# Patient Record
Sex: Female | Born: 1990 | Hispanic: Yes | Marital: Married | Smoking: Never smoker
Health system: Southern US, Community
[De-identification: ages and names within clinical notes are randomized; demographics above are authoritative.]

---
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
## 2012-06-28 ENCOUNTER — Emergency Department: Payer: Self-pay | Admitting: Unknown Physician Specialty

## 2013-08-29 ENCOUNTER — Emergency Department: Payer: Self-pay | Admitting: Emergency Medicine

## 2013-08-29 LAB — COMPREHENSIVE METABOLIC PANEL
ALT: 30 U/L
ANION GAP: 9 (ref 7–16)
AST: 27 U/L (ref 15–37)
Albumin: 3.6 g/dL (ref 3.4–5.0)
Alkaline Phosphatase: 67 U/L
BUN: 7 mg/dL (ref 7–18)
Bilirubin,Total: 0.2 mg/dL (ref 0.2–1.0)
CALCIUM: 8.5 mg/dL (ref 8.5–10.1)
CREATININE: 0.57 mg/dL — AB (ref 0.60–1.30)
Chloride: 107 mmol/L (ref 98–107)
Co2: 21 mmol/L (ref 21–32)
EGFR (African American): >60
GLUCOSE: 96 mg/dL (ref 65–99)
Osmolality: 272 (ref 275–301)
Potassium: 3.4 mmol/L — ABNORMAL LOW (ref 3.5–5.1)
Sodium: 137 mmol/L (ref 136–145)
TOTAL PROTEIN: 7.8 g/dL (ref 6.4–8.2)

## 2013-08-29 LAB — URINALYSIS, COMPLETE
BACTERIA: NONE SEEN
Bilirubin,UR: NEGATIVE
Blood: NEGATIVE
GLUCOSE, UR: NEGATIVE mg/dL (ref 0–75)
KETONE: NEGATIVE
Nitrite: NEGATIVE
PH: 5 (ref 4.5–8.0)
PROTEIN: NEGATIVE
RBC,UR: 3 /HPF (ref 0–5)
Specific Gravity: 1.024 (ref 1.003–1.030)
Squamous Epithelial: 7

## 2013-08-29 LAB — HCG, QUANTITATIVE, PREGNANCY: Beta Hcg, Quant.: 68764 m[IU]/mL — ABNORMAL HIGH

## 2013-10-11 ENCOUNTER — Encounter: Payer: Self-pay | Admitting: Obstetrics and Gynecology

## 2014-01-24 ENCOUNTER — Encounter: Payer: Self-pay | Admitting: Obstetrics and Gynecology

## 2014-03-26 ENCOUNTER — Ambulatory Visit: Payer: Self-pay | Admitting: Family Medicine

## 2014-03-26 IMAGING — US US OB FOLLOW-UP
1 series · 13 of 28 positions shown · non-contrast
Comparison: none

CLINICAL DATA: Size less than dates

EXAM:
OBSTETRIC 14+ WK ULTRASOUND FOLLOW-UP

[Series 1: us ob follow-up · 0.22mm/px · 13 of 31 slices shown]
[im 2/31]
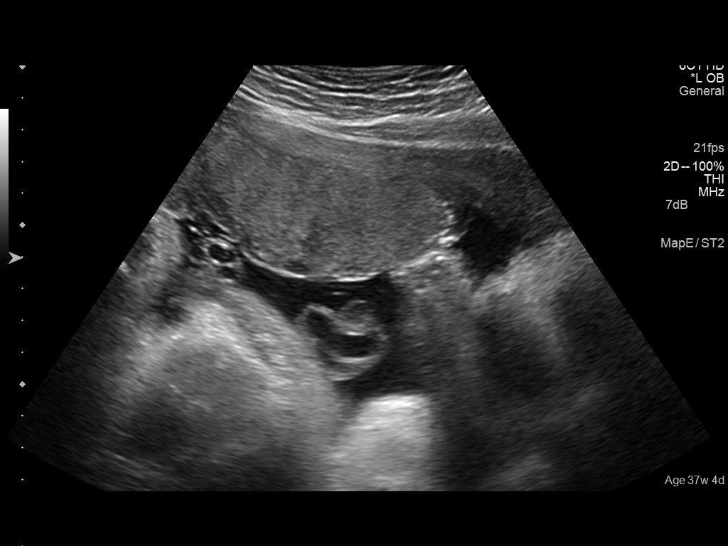
[im 4/31]
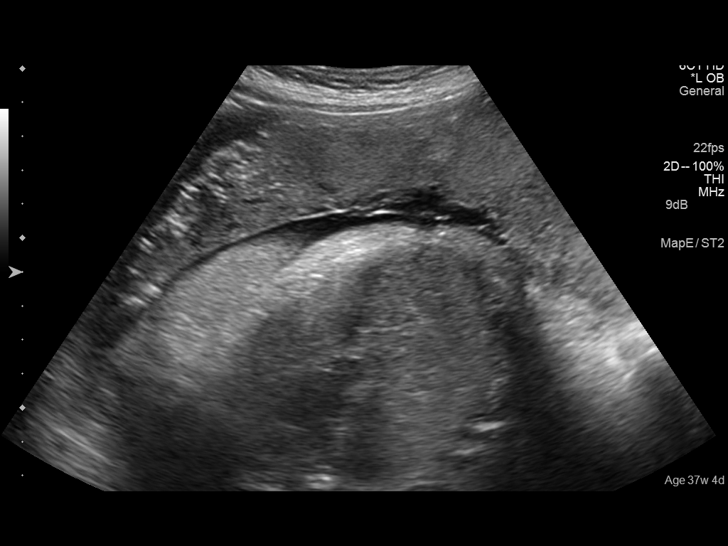
[im 6/31]
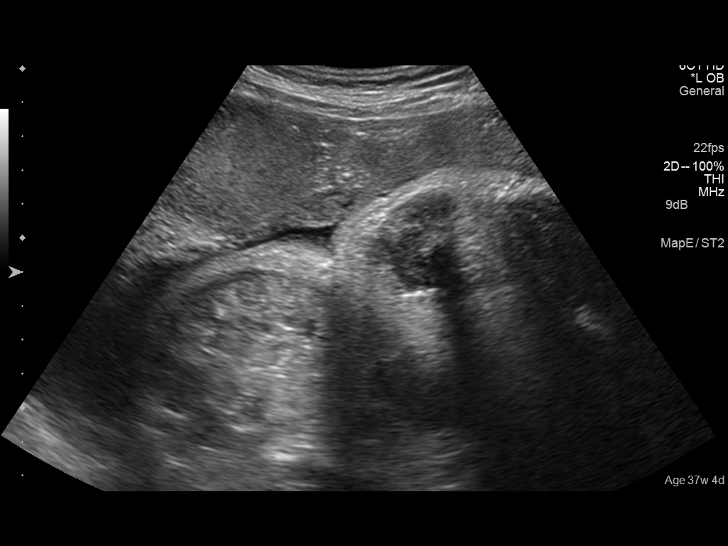
[im 8/31]
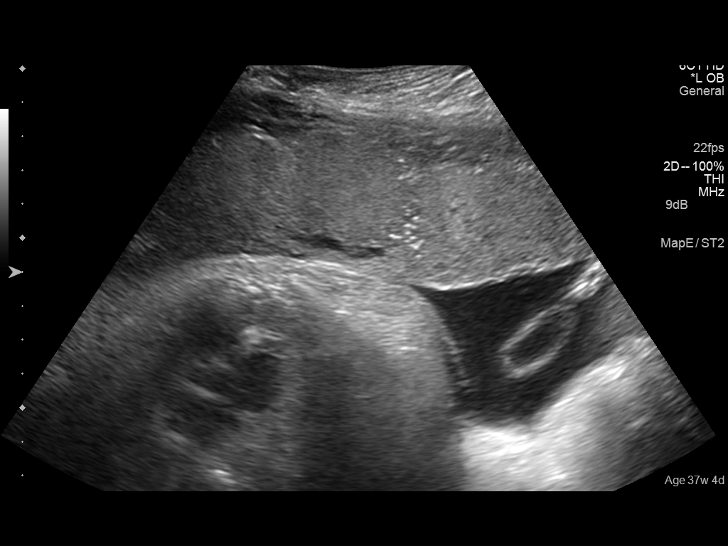
[im 11/31]
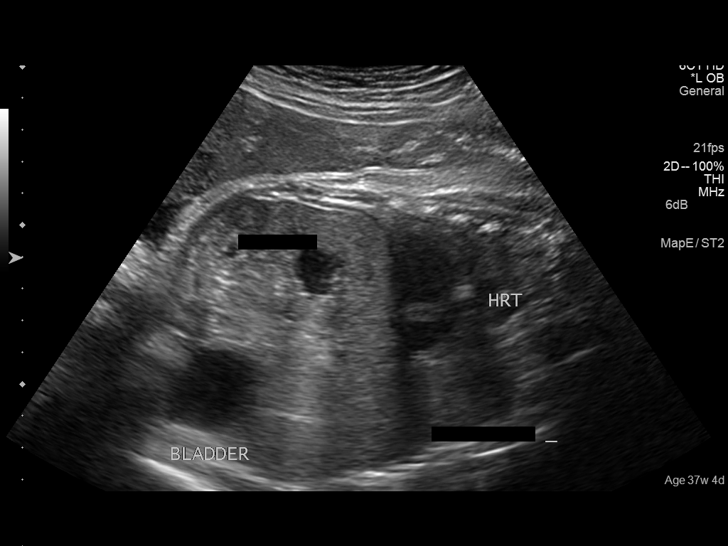
[im 13/31]
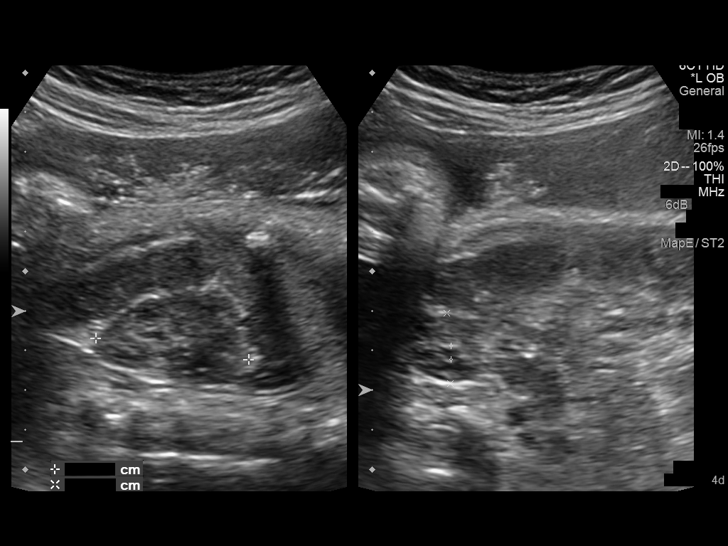
[im 16/31]
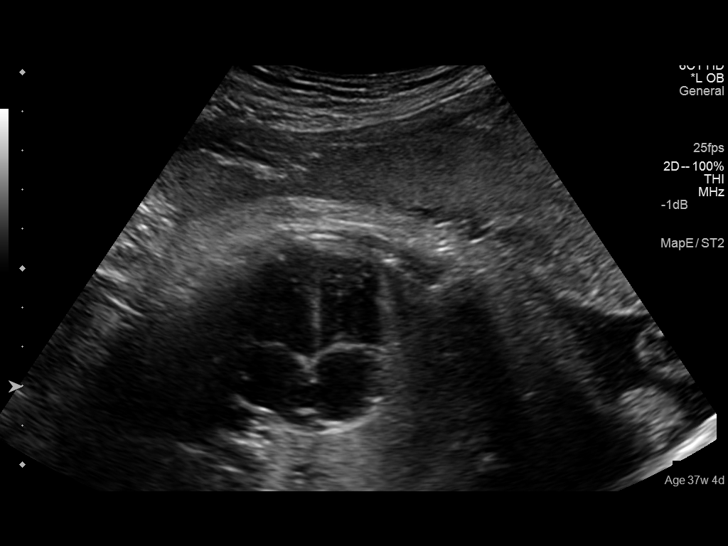
[im 18/31]
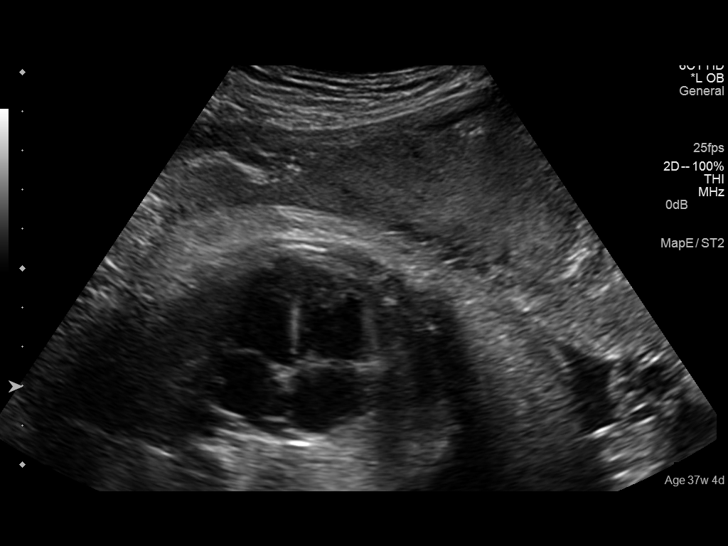
[im 21/31]
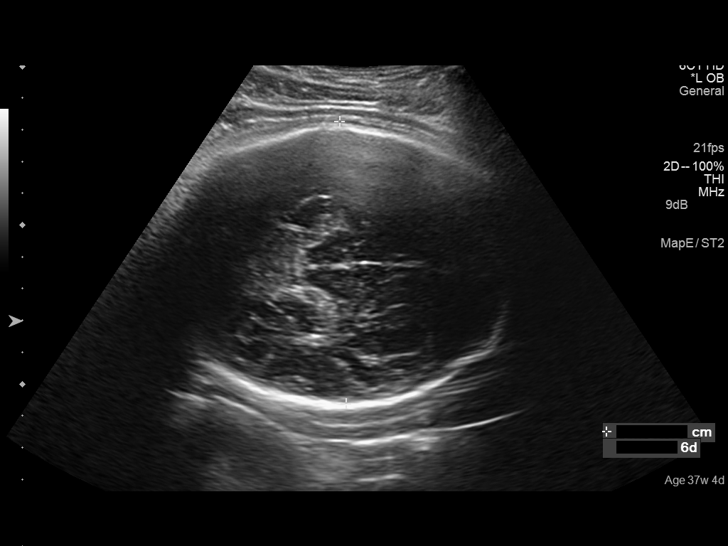
[im 23/31]
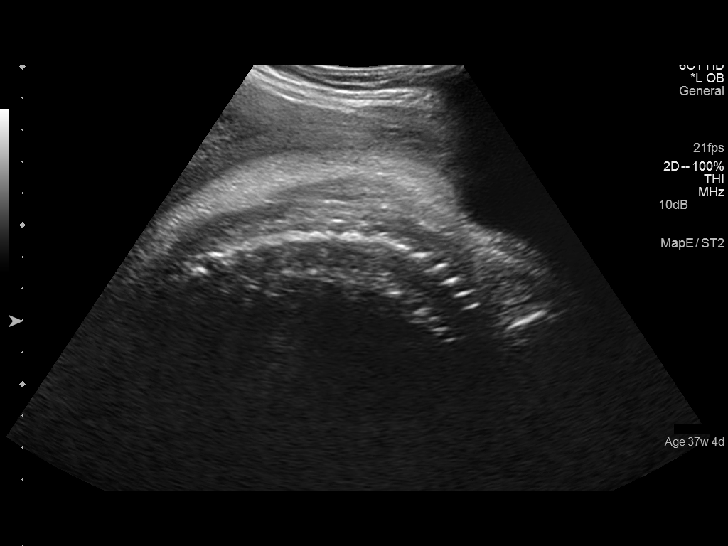
[im 25/31]
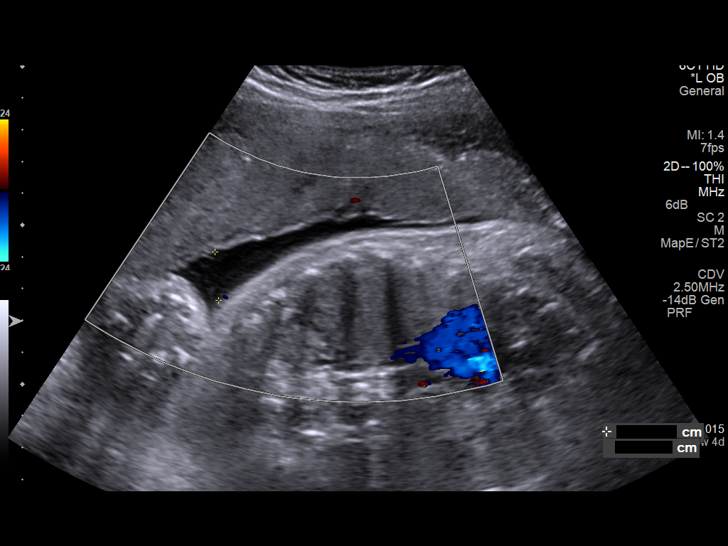
[im 27/31]
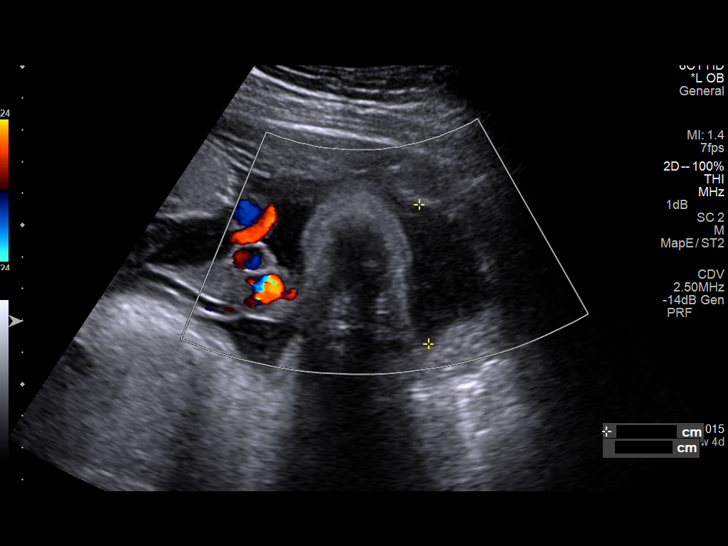
[im 29/31]
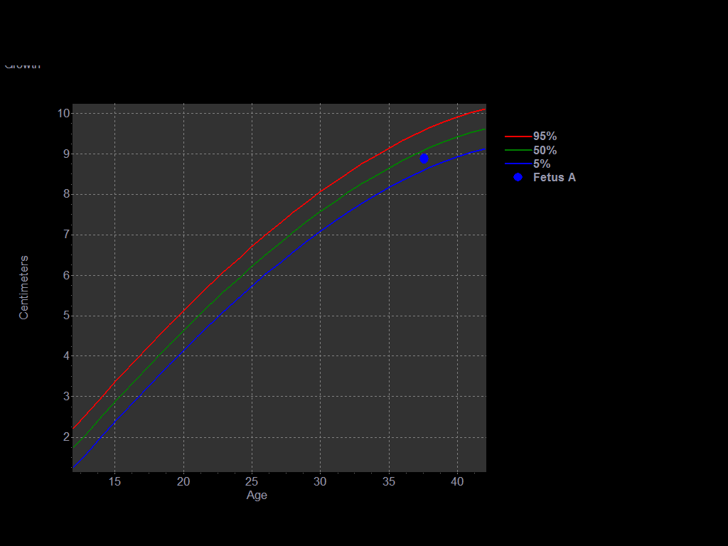

[13 of 28 positions shown; findings below may reference images not displayed]

FINDINGS: Number of Fetuses: 1

Heart Rate:  147 bpm

Movement: Present

Presentation: Cephalic

Previa: None

Placental Location: Anterior and partially fundal

Amniotic Fluid (Subjective): Normal

Amniotic Fluid (Objective):  Normal

AFI 10.0 cm cm (5%ile= 7.5 cm, 95%= 24.4 cm for 37 wks)

FETAL BIOMETRY

BPD:  8.9cm 35w   6d

HC:    33.1cm  37w   5d

AC:   33.7cm  37w   4d

FL:   7.33cm  37w   4d

Current Mean GA: 37w 1d          US EDC: [DATE]

Assigned GA:  37w 4d              Assigned EDC:  [DATE]

Estimated Fetal Weight:  3189g 54%ile

FETAL ANATOMY

Lateral Ventricles: Previously visualized

Thalami/CSP: Previously visualized

Posterior Fossa:  Previously visualized

Upper Lip: Previously visualized

Spine: Previously visualized

4 Chamber Heart on Left: Visualized

LVOT: Previously visualized  RVOT: Previously visualized

Stomach on Left: Visualized

3 Vessel Cord: Previously visualized

Cord Insertion site: Limited visualization previously.

Kidneys: Visualized

Bladder: Visualized

Extremities: Previously visualized

Sex: Female genitalia previously visualized

MATERNAL FINDINGS:

Cervix: Not evaluated
IMPRESSION: There is a viable IUP with cephalic presentation and anterior
placenta. The amniotic fluid volume is normal. The estimated
gestational age is 37 weeks 1 day which is in reasonable agreement
with the previous dating. The estimated date of confinement is [DATE]. The visualized anatomic structures are unremarkable. Is is
as

## 2014-03-30 ENCOUNTER — Emergency Department: Payer: Self-pay | Admitting: Emergency Medicine

## 2014-04-11 ENCOUNTER — Inpatient Hospital Stay: Payer: Self-pay

## 2014-06-03 LAB — SURGICAL PATHOLOGY

## 2014-06-18 NOTE — H&P (Signed)
L&D Evaluation:  History:  HPI 24 yo Hispanic female with EDC=04/12/2014 by a 7wk5d ultrasound presents to L&D with c/o contractions q5-10 min apart since 0300. Denies vaginal bleeding or LOF. PNC at [facility] notable for Chlamydia with a negative TOC, and mild iron deficiency anemia. Last vaginal delivery 2011- female 6#5oz. LABS: O POS/ UTD on MMR and Varicella vaccines/ GBS negative. HIV, RPR and HBsAG negative. glucola=111. Plans on breast feeding. Nexplanon for [facility]   Presents with contractions   Patient's Medical History No Chronic Illness   Medications Pre Natal Vitamins  Iron   Allergies NKDA   Social History none   Family History Non-Contributory   ROS:  ROS see HPI   Exam:  Vital Signs initially 130/89 then 116/75   General breathing with ctxs   Mental Status clear   Chest clear   Heart normal sinus rhythm, no murmur/gallop/rubs   Abdomen gravid, tender with contractions   Estimated Fetal Weight Average for gestational age   Fetal Position cephalic   Edema no edema   Reflexes 2+   Pelvic no external lesions, 4-5/80%/-2 per RN exam   Mebranes Intact   FHT normal rate with no decels   Ucx regular, q2-3 min   Skin dry   Impression:  Impression IUP at 39.6 weeks in labor   Plan:  Plan Admit and anticipate vaginal delivery. EFM. monitor progress and  fetal well being.   Electronic Signatures: Karene Fry (CNM)  (Signed 03-Mar-16 07:31)  Authored: L&D Evaluation   Last Updated: 03-Mar-16 07:31 by Karene Fry (CNM)

## 2019-04-28 ENCOUNTER — Ambulatory Visit: Payer: Self-pay | Attending: Internal Medicine

## 2019-04-28 DIAGNOSIS — Z23 Encounter for immunization: Secondary | ICD-10-CM

## 2019-04-28 NOTE — Progress Notes (Signed)
   Covid-19 Vaccination Clinic  Name:  Amanda Collins    MRN: [id number] DOB: April 12, 1990  04/28/2019  [name] was observed post Covid-19 immunization for 15 minutes without incident. She was provided with Vaccine Information Sheet and instruction to access the V-Safe system.   [name] was instructed to call 911 with any severe reactions post vaccine: Marland Kitchen Difficulty breathing  . Swelling of face and throat  . A fast heartbeat  . A bad rash all over body  . Dizziness and weakness   Immunizations Administered    Name Date Dose VIS Date Route   Pfizer COVID-19 Vaccine 04/28/2019  9:03 AM 0.3 mL 01/19/2019 Intramuscular   Manufacturer: [employer], [employer]   Lot: [id number]   NDC: [address]-8

## 2019-05-19 ENCOUNTER — Ambulatory Visit: Payer: Self-pay | Attending: Internal Medicine

## 2019-05-19 DIAGNOSIS — Z23 Encounter for immunization: Secondary | ICD-10-CM

## 2019-05-19 NOTE — Progress Notes (Signed)
   Covid-19 Vaccination Clinic  Name:  Amanda Collins    MRN: [id number] DOB: 09/19/1990  05/19/2019  [name] was observed post Covid-19 immunization for 15 minutes without incident. She was provided with Vaccine Information Sheet and instruction to access the V-Safe system.   [name] was instructed to call 911 with any severe reactions post vaccine: Marland Kitchen Difficulty breathing  . Swelling of face and throat  . A fast heartbeat  . A bad rash all over body  . Dizziness and weakness   Immunizations Administered    Name Date Dose VIS Date Route   Pfizer COVID-19 Vaccine 05/19/2019  3:18 PM 0.3 mL 01/19/2019 Intramuscular   Manufacturer: [employer], [employer]   Lot: [id number]   NDC: [address]-4

## 2021-03-09 ENCOUNTER — Encounter: Payer: Self-pay | Admitting: Nurse Practitioner

## 2021-03-09 ENCOUNTER — Other Ambulatory Visit: Payer: Self-pay

## 2021-03-09 ENCOUNTER — Ambulatory Visit (LOCAL_COMMUNITY_HEALTH_CENTER): Payer: Self-pay | Admitting: Nurse Practitioner

## 2021-03-09 VITALS — BP 101/69 | Ht <= 58 in | Wt 146.2 lb

## 2021-03-09 DIAGNOSIS — Z3009 Encounter for other general counseling and advice on contraception: Secondary | ICD-10-CM

## 2021-03-09 DIAGNOSIS — Z01419 Encounter for gynecological examination (general) (routine) without abnormal findings: Secondary | ICD-10-CM

## 2021-03-09 LAB — WET PREP FOR TRICH, YEAST, CLUE
Trichomonas Exam: NEGATIVE
Yeast Exam: NEGATIVE

## 2021-03-09 LAB — HM HIV SCREENING LAB: HM HIV Screening: NEGATIVE

## 2021-03-09 NOTE — Progress Notes (Signed)
Patient seen today for PE , PAP and STD check. Condoms declined. Wet prep reviewed, no tx per standing orders. No S/S's per patient. PCP list given to patient.

## 2021-03-09 NOTE — Progress Notes (Addendum)
[facility] Number: [phone]    Family Planning Visit- Initial Visit  Subjective:  Amanda Collins is a 31 y.o.  G2P1011   being seen today for an initial annual visit and to discuss reproductive life planning.  The patient is currently using IUD or IUS for pregnancy prevention. Patient reports   does not want a pregnancy in the next year.  Patient has the following medical conditions does not have a problem list on file.  Chief Complaint  Patient presents with   Annual Exam    IUD check    Patient reports to clinic today for a physical and STD screening.  Patient also had questions of how long her IUD was good for, she is contemplating on a future pregnancy.   Patient denies any current signs and symptoms.     Body mass index is 32.78 kg/m. - Patient is eligible for diabetes screening based on BMI and age [id number]?  not applicable [id number] ordered? not applicable  Patient reports 1  partner/s in last year. Desires STI screening?  Yes  Has patient been screened once for HCV in the past?  No  No results found for: HCVAB  Does the patient have current drug use (including MJ), have a partner with drug use, and/or has been incarcerated since last result? No  If yes-- Screen for HCV through [facility]   Does the patient meet criteria for HBV testing? No  Criteria:  -Household, sexual or needle sharing contact with HBV -History of drug use -HIV positive -Those with known Hep C   Health Maintenance Due  Topic Date Due   HIV Screening  Never done   Hepatitis C Screening  Never done   TETANUS/TDAP  Never done   PAP-Cervical Cytology Screening  Never done   PAP SMEAR-Modifier  Never done   COVID-19 Vaccine (3 - Booster for Pfizer series) 07/14/2019   INFLUENZA VACCINE  Never done    Review of Systems  Constitutional:  Negative for chills, fever, malaise/fatigue and weight loss.  HENT:   Negative for congestion, hearing loss and sore throat.   Eyes:  Positive for blurred vision. Negative for double vision and photophobia.       Patient states she at time cannot see far away.    Respiratory:  Negative for shortness of breath.   Cardiovascular:  Negative for chest pain.  Gastrointestinal:  Negative for abdominal pain, blood in stool, constipation, diarrhea, heartburn, nausea and vomiting.  Genitourinary:  Negative for dysuria and frequency.  Musculoskeletal:  Negative for back pain, joint pain and neck pain.  Skin:  Negative for itching and rash.  Neurological:  Negative for dizziness, weakness and headaches.  Endo/Heme/Allergies:  Does not bruise/bleed easily.  Psychiatric/Behavioral:  Negative for depression, substance abuse and suicidal ideas.    The following portions of the patient's history were reviewed and updated as appropriate: allergies, current medications, past family history, past medical history, past social history, past surgical history and problem list. Problem list updated.   See flowsheet for other program required questions.  Objective:   Vitals:   03/09/21 0848  BP: 101/69  Weight: 146 lb 3.2 oz (66.3 kg)  Height: 4\' 8"  (1.422 m)    Physical Exam HENT:     Head: Normocephalic.     Nose: Nose normal.     Mouth/Throat:     Mouth: Mucous membranes are moist.     Comments:  No visible dental caries. Patient had last dental visit 03/07/2021. Eyes:     Pupils: Pupils are equal, round, and reactive to light.  Cardiovascular:     Rate and Rhythm: Normal rate and regular rhythm.  Pulmonary:     Effort: Pulmonary effort is normal.     Breath sounds: Normal breath sounds.  Chest:     Comments: Breasts:        Right: Normal. No swelling, mass, nipple discharge, skin change or tenderness.        Left: Normal. No swelling, mass, nipple discharge, skin change or tenderness.   Abdominal:     General: Abdomen is flat. Bowel sounds are normal.      Palpations: Abdomen is soft.  Genitourinary:    Comments: External genitalia/pubic area without nits, lice, edema, erythema, lesions and inguinal adenopathy. Vagina with normal mucosa and discharge. Cervix without visible lesions. Uterus firm, mobile, nt, no masses, no CMT, no adnexal tenderness or fullness. pH 4.5. Mirena strings visualized.   Musculoskeletal:     Cervical back: Full passive range of motion without pain, normal range of motion and neck supple.  Skin:    General: Skin is warm and dry.     Capillary Refill: Capillary refill takes less than 2 seconds.  Neurological:     Mental Status: She is alert and oriented to person, place, and time.  Psychiatric:        Attention and Perception: Attention normal.        Mood and Affect: Mood normal.        Speech: Speech normal.        Behavior: Behavior is cooperative.      Assessment and Plan:  Amanda Collins is a 31 y.o. female presenting to the [facility] Department for an initial annual wellness/contraceptive visit  Contraception counseling: Reviewed all forms of birth control options in the tiered based approach. available including abstinence; over the counter/barrier methods; hormonal contraceptive medication including pill, patch, ring, injection,contraceptive implant, ECP; hormonal and nonhormonal IUDs; permanent sterilization options including vasectomy and the various tubal sterilization modalities. Risks, benefits, and typical effectiveness rates were reviewed.  Questions were answered.  Written information was also given to the patient to review.  Patient desires IUD or IUS, this was prescribed for patient.    The patient will follow up in  1 year for surveillance.  The patient was told to call with any further questions, or with any concerns about this method of contraception.  Emphasized use of condoms 100% of the time for STI prevention.  Patient was not offered ECP based on current contraception  use.    1. Family planning counseling -31 year old female in clinic for a physical and STD screening.  -Patient to continue with current birth control method (Mirena).  Patient informed of how long Mirena was good for and to notify [facility] if pregnancy is desired and would like IUD removed.  -Patient encouraged to have an eye exam to address complaints of blurry distance far away. PCP list provided to patient.   -Patient accepted all screenings including oral, vaginal CT/GC and bloodwork for HIV/RPR.  Patient meets criteria for HepB screening? No. Ordered? No - low risk  Patient meets criteria for HepC screening? No. Ordered? No - low risk   Treat wet prep per standing order Discussed time line for [facility] results and that patient will be called with positive results and encouraged patient to call if she had not heard  in 2 weeks.  Counseled to return or seek care for continued or worsening symptoms Recommended condom use with all sex  Patient is currently using *Mirena to prevent pregnancy.   - WET PREP FOR [place], YEAST, CLUE - Gonococcus culture - HIV [place] [facility] - Syphilis Serology, [place] [facility] - Chlamydia/Gonorrhea [facility]  2. Well woman exam with routine gynecological exam -Normal well woman exam today.  -CBE today, next due 02/2024. -PAP performed today.  - IGP, rfx Aptima HPV ASCU     Return in about 1 year (around 03/09/2022).    Gregary Cromer, FNP

## 2021-03-11 LAB — IGP, RFX APTIMA HPV ASCU: PAP Smear Comment: 0

## 2021-03-14 LAB — GONOCOCCUS CULTURE

## 2023-06-14 ENCOUNTER — Encounter: Payer: Self-pay | Admitting: Family Medicine

## 2023-06-14 ENCOUNTER — Ambulatory Visit: Payer: Self-pay | Admitting: Family Medicine

## 2023-06-14 VITALS — BP 102/62 | HR 77 | Ht <= 58 in | Wt 146.2 lb

## 2023-06-14 DIAGNOSIS — Z0000 Encounter for general adult medical examination without abnormal findings: Secondary | ICD-10-CM

## 2023-06-14 DIAGNOSIS — Z113 Encounter for screening for infections with a predominantly sexual mode of transmission: Secondary | ICD-10-CM

## 2023-06-14 DIAGNOSIS — Z975 Presence of (intrauterine) contraceptive device: Secondary | ICD-10-CM

## 2023-06-14 DIAGNOSIS — Z3009 Encounter for other general counseling and advice on contraception: Secondary | ICD-10-CM

## 2023-06-14 LAB — WET PREP FOR TRICH, YEAST, CLUE
Clue Cell Exam: NEGATIVE
Trichomonas Exam: NEGATIVE
Yeast Exam: NEGATIVE

## 2023-06-14 LAB — HM HIV SCREENING LAB: HM HIV Screening: NEGATIVE

## 2023-06-14 NOTE — Progress Notes (Signed)
 Pt is here for annual visit. Wet prep results reviewed with pt, no treatment required per standing order. Condoms declined. Austine Lefort, RN.

## 2023-06-14 NOTE — Progress Notes (Addendum)
 [employer] [facility]PART[facility] [address] [place] [address] Main phone: [phone]  Family Planning Visit - Repeat Yearly Visit  Subjective:  Amanda Collins is a 33 y.o. [id number]  being seen today for an annual wellness visit and to discuss contraception options. The patient is currently using IUD (or IUS) for pregnancy prevention. Patient does not want a pregnancy in the next year.   Patient reports they are looking for a method with the following characteristics:  High efficacy at preventing pregnancy  Patient has the following medical problems:  Patient Active Problem List   Diagnosis Date Noted   IUD (intrauterine device) in place 06/14/2023    Chief Complaint  Patient presents with   Annual Exam    Pt is here for PE and IUD removal.    HPI Patient reports to clinic for PE.  Patient denies concerns about self    Review of Systems  Constitutional:  Negative for weight loss.  Eyes:  Negative for blurred vision.  Respiratory:  Negative for cough and shortness of breath.   Cardiovascular:  Negative for claudication.  Gastrointestinal:  Negative for nausea.  Genitourinary:  Negative for dysuria and frequency.  Skin:  Negative for rash.  Neurological:  Negative for headaches.  Endo/Heme/Allergies:  Does not bruise/bleed easily.    See flowsheet for other program required questions.   Diabetes screening This patient is 33 y.o. with a BMI of Body mass index is 32.78 kg/m.Aaron Aas  Is patient eligible for diabetes screening (age >35 and BMI >25)?  no  Was Hgb A1c ordered? not applicable  STI screening Patient reports 1 of partners in last year.  Does this patient desire STI screening?  Yes  Hepatitis C screening Has patient been screened once for HCV in the past?  No  No results found for: "HCVAB"  Does the patient meet criteria for HCV testing? No  (If yes-- Screen for HCV through [facility]) Criteria:   Since the last HCV result, does the patient have any of the following? - Current drug use - Have a partner with drug use - Has been incarcerated  Hepatitis B screening Does the patient meet criteria for HBV testing? No Criteria:  -Household, sexual or needle sharing contact with HBV -History of drug use -HIV positive -Those with known Hep C  Cervical Cancer Screening  Result Date Procedure Results Follow-ups  03/09/2021 IGP, rfx Aptima HPV ASCU DIAGNOSIS:: Comment Specimen adequacy:: Comment Clinician Provided ICD10: Comment Performed by:: Comment PAP Smear Comment: . Note:: Comment Test Methodology: Comment PAP Reflex: Comment     Health Maintenance Due  Topic Date Due   Hepatitis C Screening  Never done   DTaP/Tdap/Td (1 - Tdap) Never done   COVID-19 Vaccine (3 - 2024-25 season) 10/10/2022    The following portions of the patient's history were reviewed and updated as appropriate: allergies, current medications, past family history, past medical history, past social history, past surgical history and problem list. Problem list updated.  Objective:   Vitals:   06/14/23 1540 06/14/23 1542  BP: (!) 97/59 102/62  Pulse: 77   Weight: 146 lb 3.2 oz (66.3 kg) 146 lb 3.2 oz (66.3 kg)  Height: 4\' 8"  (1.422 m)     Physical Exam Vitals and nursing note reviewed. Exam conducted with a chaperone present Susanna Epley CMA).  Constitutional:      Appearance: Normal appearance.  HENT:     Head: Normocephalic and atraumatic.  Mouth/Throat:     Mouth: Mucous membranes are moist.     Pharynx: Oropharynx is clear. No oropharyngeal exudate or posterior oropharyngeal erythema.  Pulmonary:     Effort: Pulmonary effort is normal.  Chest:  Breasts:    Tanner Score is 5.     Right: Normal. No mass, nipple discharge, skin change or tenderness.     Left: Normal. No mass, nipple discharge, skin change or tenderness.  Abdominal:     General: Abdomen is flat.     Palpations: There  is no mass.     Tenderness: There is no abdominal tenderness. There is no rebound.  Genitourinary:    General: Normal vulva.     Exam position: Lithotomy position.     Pubic Area: No rash or pubic lice.      Labia:        Right: No rash or lesion.        Left: No rash or lesion.      Vagina: Normal. No vaginal discharge, erythema, bleeding or lesions.     Cervix: No cervical motion tenderness, discharge, friability, lesion or erythema.     Uterus: Normal.      Adnexa: Right adnexa normal and left adnexa normal.     Rectum: Normal.     Comments: pH = 5-6 Lymphadenopathy:     Head:     Right side of head: No preauricular or posterior auricular adenopathy.     Left side of head: No preauricular or posterior auricular adenopathy.     Cervical: No cervical adenopathy.     Upper Body:     Right upper body: No supraclavicular, axillary or epitrochlear adenopathy.     Left upper body: No supraclavicular, axillary or epitrochlear adenopathy.     Lower Body: No right inguinal adenopathy. No left inguinal adenopathy.  Skin:    General: Skin is warm and dry.     Findings: No rash.  Neurological:     Mental Status: She is alert and oriented to person, place, and time.     Assessment and Plan:  Amanda Collins is a 33 y.o. female [phone] presenting to the [facility] Department for an yearly wellness and contraception visit  1. Family planning (Primary) Contraception counseling:  Reviewed options based on patient desire and reproductive life plan. Patient is interested in IUD or IUS. This was not provided to the patient today. IUD already in place  Risks, benefits, and typical effectiveness rates were reviewed.  Questions were answered.  Written information was also given to the patient to review.    The patient will follow up in  1 years for surveillance.  The patient was told to call with any further questions, or with any concerns about this method of contraception.   Emphasized use of condoms 100% of the time for STI prevention.  Emergency Contraception Precautions (ECP): Patient assessed for need of ECP. She is not a candidate based on LARC in place and unexpired .  Educated on ECP and reviewed options.  Patient desires no method - patient politely declines any emergency contraception.   2. IUD (intrauterine device) in place Mirena placed in 2019, good until 2027  3. Screening for venereal disease -denies symptoms   - Chlamydia/Gonorrhea [place] [facility] - HIV [place] [facility] - Syphilis Serology, [place] [facility] - WET PREP FOR [place], YEAST, CLUE  4. Well woman exam (no gynecological exam) -reports cyclical breast tenderness when she would expect a period- but  has no menses -counseled this is common with IUD- and not a concerning symptoms -Pap smear up to date    Return in about 1 year (around 06/13/2024) for annual well-woman exam.  No future appointments.  Earleen Glazier, [place]

## 2023-06-23 ENCOUNTER — Encounter: Payer: Self-pay | Admitting: Family Medicine
